# Patient Record
Sex: Male | Born: 1980 | Race: Asian | Hispanic: No | Marital: Married | Smoking: Never smoker
Health system: Southern US, Community
[De-identification: ages and names within clinical notes are randomized; demographics above are authoritative.]

## PROBLEM LIST (undated history)

## (undated) DIAGNOSIS — IMO0002 Reserved for concepts with insufficient information to code with codable children: Secondary | ICD-10-CM

## (undated) DIAGNOSIS — R011 Cardiac murmur, unspecified: Secondary | ICD-10-CM

## (undated) DIAGNOSIS — T7840XA Allergy, unspecified, initial encounter: Secondary | ICD-10-CM

## (undated) HISTORY — DX: Reserved for concepts with insufficient information to code with codable children: IMO0002

## (undated) HISTORY — DX: Cardiac murmur, unspecified: R01.1

## (undated) HISTORY — DX: Allergy, unspecified, initial encounter: T78.40XA

## (undated) HISTORY — PX: CARDIAC SURGERY: SHX584

## (undated) HISTORY — PX: CORONARY ARTERY BYPASS GRAFT: SHX141

---

## 2012-12-02 ENCOUNTER — Emergency Department (HOSPITAL_COMMUNITY): Payer: BC Managed Care – PPO

## 2012-12-02 ENCOUNTER — Encounter (HOSPITAL_COMMUNITY): Payer: Self-pay | Admitting: Emergency Medicine

## 2012-12-02 ENCOUNTER — Emergency Department (HOSPITAL_COMMUNITY)
Admission: EM | Admit: 2012-12-02 | Discharge: 2012-12-02 | Disposition: A | Discharge status: Home / Self Care | Payer: BC Managed Care – PPO | Attending: Emergency Medicine | Admitting: Emergency Medicine

## 2012-12-02 DIAGNOSIS — R51 Headache: Secondary | ICD-10-CM | POA: Insufficient documentation

## 2012-12-02 NOTE — ED Notes (Signed)
Patient transported to CT 

## 2012-12-02 NOTE — ED Notes (Signed)
Per pt, right top of headache on and off for a year-has not seen anyone for symptoms

## 2012-12-02 NOTE — ED Provider Notes (Signed)
CSN: [id number]     Arrival date & time 12/02/12  0940 History   First MD Initiated Contact with Patient 12/02/12 1023     Chief Complaint  Patient presents with  . Headache   (Consider location/radiation/quality/duration/timing/severity/associated sxs/prior Treatment) Patient is a 32 y.o. male presenting with headaches. The history is provided by the patient. No language interpreter was used.  Headache Associated symptoms: no diarrhea, no dizziness, no fever, no nausea, no numbness and no vomiting   Mitchell Perkins is a 32 year old male with no significant past medical history presenting to emergency department with on and off headache that has been ongoing for approximately one year. Patient reports that the headache discomfort is localized at the same spot every time mainly to the right side, vertex region. Patient reports that his current episode of discomfort has been ongoing consistently for one month. Patient reports that when no pressure is applied to the site it is an irritating,bruising sensation, when pressure is applied as a sharp pain. Patient reports that he has been taking Aleve with minimal relief. Patient denies discussing this case with his primary care provider, reported that it was not important to him at the time. Denied radiation of pain. Denied loss of consciousness, head injury, blurred vision, visual distortions, dizziness, and proper balance, syncope, nausea, vomiting, abdominal pain, numbness, tingling, loss of sensation, difficulty swallowing, chest pain, shortness of breath, difficulty breathing, hearing loss or deficiency. Denied history of HTN, DM. PCP Dr. Alanson Aly  History reviewed. No pertinent past medical history. Past Surgical History  Procedure Laterality Date  . Cardiac surgery     No family history on file. History  Substance Use Topics  . Smoking status: Never Smoker   . Smokeless tobacco: Not on file  . Alcohol Use: No    Review of Systems  Constitutional:  Negative for fever and chills.  HENT: Negative for trouble swallowing.   Respiratory: Negative for shortness of breath.   Cardiovascular: Negative for chest pain.  Gastrointestinal: Negative for nausea, vomiting and diarrhea.  Neurological: Positive for headaches. Negative for dizziness, weakness and numbness.  All other systems reviewed and are negative.    Allergies  Review of patient's allergies indicates no known allergies.  Home Medications   Current Outpatient Rx  Name  Route  Sig  Dispense  Refill  . loratadine (CLARITIN) 10 MG tablet   Oral   Take 10 mg by mouth daily as needed for allergies.         . naproxen sodium (ANAPROX) 220 MG tablet   Oral   Take 220 mg by mouth daily as needed (pain).         Marland Kitchen OVER THE COUNTER MEDICATION   Oral   Take 1 tablet by mouth daily as needed (allergies).          BP 115/69  Pulse 69  Temp(Src) 97.8 F (36.6 C) (Oral)  Resp 20  SpO2 97% Physical Exam  Nursing note and vitals reviewed. Constitutional: He is oriented to person, place, and time. He appears well-developed and well-nourished. No distress.  HENT:  Head: Normocephalic and atraumatic.    Mouth/Throat: Oropharynx is clear and moist. No oropharyngeal exudate.  Negative trismus Discomfort upon palpation to the right parietal region, near vertex of scalp - divot noted  Eyes: Conjunctivae and EOM are normal. Pupils are equal, round, and reactive to light. Right eye exhibits no discharge. Left eye exhibits no discharge.  Negative nystagmus  Neck: Normal range of motion. Neck supple.  Cardiovascular: Normal rate, regular rhythm and normal heart sounds.  Exam reveals no friction rub.   No murmur heard. Pulmonary/Chest: Effort normal and breath sounds normal. No respiratory distress. He has no wheezes. He has no rales.  Musculoskeletal: Normal range of motion.  Neurological: He is alert and oriented to person, place, and time. No cranial nerve deficit or sensory  deficit. He exhibits normal muscle tone. Coordination normal. GCS eye subscore is 4. GCS verbal subscore is 5. GCS motor subscore is 6.  Cranial nerves II through XII grossly intact Strength 5+/5+ to upper and lower extremities bilaterally with resistance applied, equal distribution Sensation intact Gait proper, proper balance Negative sway or drop foot, negative step off Patient able to walk heel to toe and onto the toes without difficulty Proper balance with standing  Skin: Skin is warm and dry. He is not diaphoretic. No erythema.  Psychiatric: He has a normal mood and affect. His behavior is normal. Thought content normal.    ED Course  Procedures (including critical care time) Labs Review Labs Reviewed - No data to display Imaging Review Ct Head Wo Contrast  12/02/2012   CLINICAL DATA:  Acute onset headache  EXAM: CT HEAD WITHOUT CONTRAST  TECHNIQUE: Contiguous axial images were obtained from the base of the skull through the vertex without intravenous contrast. Study was obtained within 24 hr of patient's arrival at the emergency department.  COMPARISON:  None.  FINDINGS: The ventricles are normal in size and configuration. There is no mass, hemorrhage, extra-axial fluid collection, or midline shift. Gray-white compartments are normal. There is no demonstrable acute infarct.  There is evidence of previous trauma with remodeling along the skull vertex. There is no acute fracture. No blastic or lytic bone lesion. Mastoid air cells are clear.  IMPRESSION: Evidence of old trauma along the skull vertex with remodeling. Study otherwise unremarkable.   Electronically Signed   By: Bretta Bang M.D.   On: 12/02/2012 11:29    EKG Interpretation   None       MDM   1. Head pain     Filed Vitals:   12/02/12 1017 12/02/12 1026  BP: 111/69 115/69  Pulse: 67 69  Temp: 98.2 F (36.8 C) 97.8 F (36.6 C)  TempSrc: Oral Oral  Resp: 16 20  SpO2: 100% 97%    Patient presenting to  emergency department with head discomfort that has been ongoing intermittently for the past year. Patient reports that it has exacerbated over the past month, has been consistent described as an irritating, pressure sensation with a sharp pain when pressure is applied to the site. Alert and oriented. Negative deformities or trauma noted to the face. Near the right vertex, parietal region patient has a mild digit with discomfort upon palpation. Negative neurological deficits identified. Cranial nerves grossly intact. Full range of motion to upper lower extremity. Gait proper, proper balance. Proper balance and stance. Find motor skills intact. Sensation intact.No neurological deficits and normal neuro exam.   CT head noted evidence of old trauma along the skull vertex with remodeling identified - negative acute abnormalities identified. Patient with back pain.   Patient stable, afebrile. Discharge patient with referral to primary care provider and neurologist. Discussed with patient to rest and stay hydrated discussed with patient to closely monitor symptoms if symptoms are to worsen or change report back to emergency department  - strict return instructions given. Patient agreed to plan of care, understood, all questions answered.    Raymon Mutton, PA-C  12/04/12 1345 

## 2012-12-04 NOTE — ED Provider Notes (Signed)
Medical screening examination/treatment/procedure(s) were performed by non-physician practitioner and as supervising physician I was immediately available for consultation/collaboration.  EKG Interpretation   None        Aeneas Longsworth T Saadia Dewitt, MD 12/04/12 1446 

## 2013-01-03 ENCOUNTER — Other Ambulatory Visit: Payer: Self-pay | Admitting: Neurosurgery

## 2013-01-03 DIAGNOSIS — M542 Cervicalgia: Secondary | ICD-10-CM

## 2013-01-13 ENCOUNTER — Ambulatory Visit
Admission: RE | Admit: 2013-01-13 | Discharge: 2013-01-13 | Disposition: A | Discharge status: Home / Self Care | Payer: BC Managed Care – PPO | Source: Ambulatory Visit | Attending: Neurosurgery | Admitting: Neurosurgery

## 2013-01-13 ENCOUNTER — Other Ambulatory Visit: Payer: Self-pay | Admitting: Neurosurgery

## 2013-01-13 DIAGNOSIS — Z1810 Retained metal fragments, unspecified: Secondary | ICD-10-CM

## 2013-01-13 DIAGNOSIS — M542 Cervicalgia: Secondary | ICD-10-CM

## 2014-06-29 IMAGING — CR DG CHEST 1V
1 series · 1 of 1 positions shown · non-contrast
Comparison: None.

CLINICAL DATA: History of congenital heart defect with open heart
surgery as a child. Pre MRI screening. Possible metallic implants in
the heart.

EXAM:
CHEST - 1 VIEW

[view not recorded]
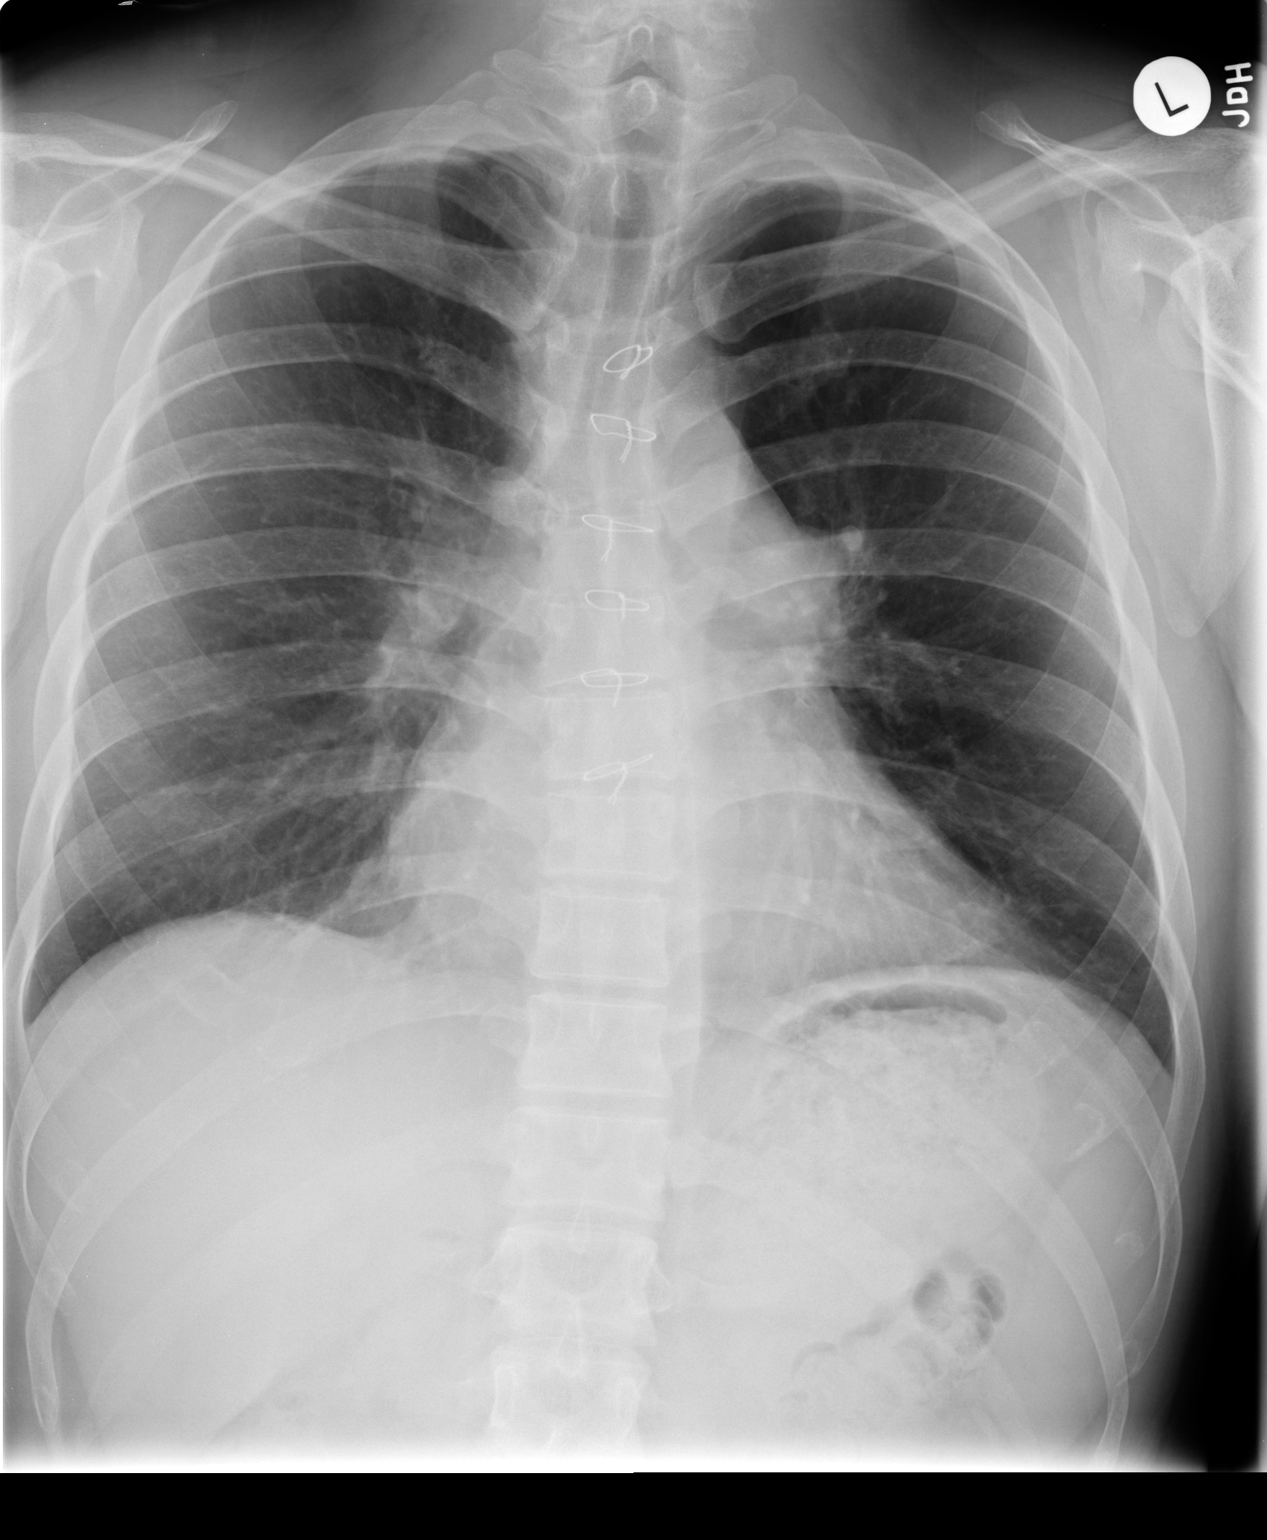

[1 of 1 positions shown; findings below may reference images not displayed]

FINDINGS: The cardiopericardial silhouette is upper limits of normal for
projection. There is prominence of the pulmonary arteries
bilaterally, likely associated with congenital heart disease. Median
sternotomy wires are present compatible with childhood open heart
surgery. Lungs appear clear.

Significantly, and no prosthetic apparatus is identified over the
heart.
IMPRESSION: Postoperative changes of the chest without cardiopulmonary disease.
No radiopaque prosthesis identified precluding MRI.

## 2016-01-09 DIAGNOSIS — R011 Cardiac murmur, unspecified: Secondary | ICD-10-CM | POA: Insufficient documentation

## 2016-01-30 DIAGNOSIS — E785 Hyperlipidemia, unspecified: Secondary | ICD-10-CM | POA: Insufficient documentation

## 2016-01-30 DIAGNOSIS — Q244 Congenital subaortic stenosis: Secondary | ICD-10-CM | POA: Insufficient documentation

## 2019-05-19 ENCOUNTER — Ambulatory Visit: Payer: Self-pay | Attending: Internal Medicine

## 2019-05-19 DIAGNOSIS — Z23 Encounter for immunization: Secondary | ICD-10-CM

## 2019-05-19 NOTE — Progress Notes (Signed)
   Covid-19 Vaccination Clinic  Name:  Mitchell Perkins    MRN: [id number] DOB: 1980/02/15  05/19/2019  Mr. Mitchell Perkins was observed post Covid-19 immunization for 15 minutes without incident. He was provided with Vaccine Information Sheet and instruction to access the V-Safe system.   Mr. Mitchell Perkins was instructed to call 911 with any severe reactions post vaccine: Marland Kitchen Difficulty breathing  . Swelling of face and throat  . A fast heartbeat  . A bad rash all over body  . Dizziness and weakness   Immunizations Administered    Name Date Dose VIS Date Route   Pfizer COVID-19 Vaccine 05/19/2019  2:24 PM 0.3 mL 03/22/2018 Intramuscular   Manufacturer: [employer], [employer]   Lot: [id number]   NDC: [address]-2

## 2019-06-12 ENCOUNTER — Ambulatory Visit: Payer: Self-pay | Attending: Internal Medicine

## 2019-06-12 DIAGNOSIS — Z23 Encounter for immunization: Secondary | ICD-10-CM

## 2019-06-12 NOTE — Progress Notes (Signed)
   Covid-19 Vaccination Clinic  Name:  Benedetto Ryder    MRN: [id number] DOB: Aug 14, 1980  06/12/2019  Mr. Bernabei was observed post Covid-19 immunization for 15 minutes without incident. He was provided with Vaccine Information Sheet and instruction to access the V-Safe system.   Mr. Korol was instructed to call 911 with any severe reactions post vaccine: Marland Kitchen Difficulty breathing  . Swelling of face and throat  . A fast heartbeat  . A bad rash all over body  . Dizziness and weakness   Immunizations Administered    Name Date Dose VIS Date Route   Pfizer COVID-19 Vaccine 06/12/2019  1:40 PM 0.3 mL 03/22/2018 Intramuscular   Manufacturer: [employer], [employer]   Lot: [id number]   NDC: [address]-4

## 2019-12-30 ENCOUNTER — Ambulatory Visit: Payer: Self-pay

## 2020-02-09 ENCOUNTER — Ambulatory Visit (HOSPITAL_COMMUNITY)
Admission: EM | Admit: 2020-02-09 | Discharge: 2020-02-09 | Disposition: A | Discharge status: Home / Self Care | Payer: BLUE CROSS/BLUE SHIELD | Attending: Student | Admitting: Student

## 2020-02-09 ENCOUNTER — Ambulatory Visit (INDEPENDENT_AMBULATORY_CARE_PROVIDER_SITE_OTHER): Payer: BLUE CROSS/BLUE SHIELD

## 2020-02-09 ENCOUNTER — Encounter (HOSPITAL_COMMUNITY): Payer: Self-pay

## 2020-02-09 DIAGNOSIS — R059 Cough, unspecified: Secondary | ICD-10-CM

## 2020-02-09 DIAGNOSIS — J209 Acute bronchitis, unspecified: Secondary | ICD-10-CM

## 2020-02-09 MED ORDER — BENZONATATE 100 MG PO CAPS: 100.0000 mg | ORAL_CAPSULE | Freq: Three times a day (TID) | ORAL | 0 refills | Status: DC

## 2020-02-09 MED ORDER — PREDNISONE 20 MG PO TABS: 40.0000 mg | ORAL_TABLET | Freq: Every day | ORAL | 0 refills | Status: AC

## 2020-02-09 MED ORDER — ALBUTEROL SULFATE HFA 108 (90 BASE) MCG/ACT IN AERS: 1.0000 | INHALATION_SPRAY | Freq: Four times a day (QID) | RESPIRATORY_TRACT | 0 refills | Status: AC | PRN

## 2020-02-09 NOTE — ED Triage Notes (Signed)
Pt presents with a cough that started 01/04. He states he went to an urgent care that gave him cough syrup. He states he did not have any relief. Pt states he was told to come and get a chest x-ray done. Pt states he is not having any other sxs except for a cough. Pt denies chest pain. Pt states he was tested last week and his results came back negative today.

## 2020-02-09 NOTE — Discharge Instructions (Signed)
-  Prednisone 40mg  (two pills) daily for 5 days -Tessalon (Benzonatate) as needed, up to 3x daily  -Albuterol inhaler 1-2 puffs as needed -Continue cough syrup

## 2020-02-09 NOTE — ED Provider Notes (Signed)
[facility]    CSN: [id number] Arrival date & time: 02/09/20  1154      History   Chief Complaint Chief Complaint  Patient presents with  . Cough    HPI Mitchell Perkins is a 40 y.o. male Presenting for URI symptoms for 2 weeks. States he first developed URI symptoms 2 weeks ago on 01/28/2020. Had a negative covid test 01/30/2020. Continued to experience significant cough until 02/03/2020, upon which he presented to urgent care. They treated him for bronchitis with zpack, cough syrup, tessalon, prednisone 40mg  x5 days. He presents today for continued cough. States he's coughing every 20 minutes. Productive cough with clear mucus. Feeling well otherwise- denies shortness of breath, sore throat, fevers. Denies fevers/chills, n/v/d, shortness of breath, chest pain, congestion, facial pain, teeth pain, headaches, sore throat, loss of taste/smell, swollen lymph nodes, ear pain. Denies chest pain, shortness of breath, confusion, high fevers.   HPI  History reviewed. No pertinent past medical history.  There are no problems to display for this patient.   Past Surgical History:  Procedure Laterality Date  . CARDIAC SURGERY         Home Medications    Prior to Admission medications   Medication Sig Start Date End Date Taking? Authorizing Provider  albuterol (VENTOLIN HFA) 108 (90 Base) MCG/ACT inhaler Inhale 1-2 puffs into the lungs every 6 (six) hours as needed for wheezing or shortness of breath. 02/09/20  Yes 02/11/20, PA-C  benzonatate (TESSALON) 100 MG capsule Take 1 capsule (100 mg total) by mouth every 8 (eight) hours. 02/09/20  Yes 02/11/20, PA-C  predniSONE (DELTASONE) 20 MG tablet Take 2 tablets (40 mg total) by mouth daily for 5 days. 02/09/20 02/14/20 Yes 02/16/20, PA-C  loratadine (CLARITIN) 10 MG tablet Take 10 mg by mouth daily as needed for allergies.    [provider]  naproxen sodium (ANAPROX) 220 MG tablet Take 220 mg by mouth daily as  needed (pain).    [provider]  OVER THE COUNTER MEDICATION Take 1 tablet by mouth daily as needed (allergies).    [provider]    Family History History reviewed. No pertinent family history.  Social History Social History   Tobacco Use  . Smoking status: Never Smoker  . Smokeless tobacco: Never Used  Substance Use Topics  . Alcohol use: No  . Drug use: No     Allergies   Patient has no known allergies.   Review of Systems Review of Systems  Constitutional: Negative for appetite change, chills and fever.  HENT: Negative for congestion, ear pain, rhinorrhea, sinus pressure, sinus pain and sore throat.   Eyes: Negative for redness and visual disturbance.  Respiratory: Positive for cough. Negative for chest tightness, shortness of breath and wheezing.   Cardiovascular: Negative for chest pain and palpitations.  Gastrointestinal: Negative for abdominal pain, constipation, diarrhea, nausea and vomiting.  Genitourinary: Negative for dysuria, frequency and urgency.  Musculoskeletal: Negative for myalgias.  Neurological: Negative for dizziness, weakness and headaches.  Psychiatric/Behavioral: Negative for confusion.  All other systems reviewed and are negative.    Physical Exam Triage Vital Signs ED Triage Vitals  Enc Vitals Group     BP 02/09/20 1330 127/84     Pulse Rate 02/09/20 1330 (!) 110     Resp 02/09/20 1330 15     Temp 02/09/20 1330 98 F (36.7 C)     Temp Source 02/09/20 1330 Oral  SpO2 02/09/20 1330 98 %     Weight --      Height --      Head Circumference --      Peak Flow --      Pain Score 02/09/20 1328 0     Pain Loc --      Pain Edu? --      Excl. in GC? --    No data found.  Updated Vital Signs BP 127/84 (BP Location: Right Arm)   Pulse (!) 110   Temp 98 F (36.7 C) (Oral)   Resp 15   SpO2 98%   Visual Acuity Right Eye Distance:   Left Eye Distance:   Bilateral Distance:    Right Eye Near:   Left Eye  Near:    Bilateral Near:     Physical Exam Vitals reviewed.  Constitutional:      General: He is not in acute distress.    Appearance: Normal appearance. He is not ill-appearing.  HENT:     Head: Normocephalic and atraumatic.     Right Ear: Hearing, tympanic membrane, ear canal and external ear normal. No swelling or tenderness. There is no impacted cerumen. No mastoid tenderness. Tympanic membrane is not perforated, erythematous, retracted or bulging.     Left Ear: Hearing, tympanic membrane, ear canal and external ear normal. No swelling or tenderness. There is no impacted cerumen. No mastoid tenderness. Tympanic membrane is not perforated, erythematous, retracted or bulging.     Nose:     Right Sinus: No maxillary sinus tenderness or frontal sinus tenderness.     Left Sinus: No maxillary sinus tenderness or frontal sinus tenderness.     Mouth/Throat:     Mouth: Mucous membranes are moist.     Pharynx: Uvula midline. No oropharyngeal exudate or posterior oropharyngeal erythema.     Tonsils: No tonsillar exudate.  Cardiovascular:     Rate and Rhythm: Regular rhythm. Tachycardia present.     Heart sounds: Normal heart sounds.  Pulmonary:     Breath sounds: Normal breath sounds and air entry. No wheezing, rhonchi or rales.  Chest:     Chest wall: No tenderness.  Abdominal:     General: Abdomen is flat. Bowel sounds are normal.     Tenderness: There is no abdominal tenderness. There is no guarding or rebound.  Lymphadenopathy:     Cervical: No cervical adenopathy.  Neurological:     General: No focal deficit present.     Mental Status: He is alert and oriented to person, place, and time.  Psychiatric:        Attention and Perception: Attention and perception normal.        Mood and Affect: Mood and affect normal.        Behavior: Behavior normal. Behavior is cooperative.        Thought Content: Thought content normal.        Judgment: Judgment normal.      UC Treatments /  Results  Labs (all labs ordered are listed, but only abnormal results are displayed) Labs Reviewed - No data to display  EKG   Radiology DG Chest 2 View  Result Date: 02/09/2020 CLINICAL DATA:  Cough times 10 days. EXAM: CHEST - 2 VIEW COMPARISON:  03/18/2016 FINDINGS: The lungs are clear without focal pneumonia, edema, pneumothorax or pleural effusion. The cardiopericardial silhouette is within normal limits for size. Fullness in the AP window/left hilum is stable in the interval. The visualized bony structures of  the thorax show no acute abnormality. IMPRESSION: Stable exam. No acute cardiopulmonary findings. Electronically Signed   By: Kennith Center M.D.   On: 02/09/2020 15:08    Procedures Procedures (including critical care time)  Medications Ordered in UC Medications - No data to display  Initial Impression / Assessment and Plan / UC Course  I have reviewed the triage vital signs and the nursing notes.  Pertinent labs & imaging results that were available during my care of the patient were reviewed by me and considered in my medical decision making (see chart for details).     Afebrile mildly tachycardic nontachypneic oxygenating well on room air, no wheezes rhonchi or rales.  CXR today with no acute cardiopulmonary disease.   Continue tessalon, cough syrup.  Discussed that pt does not have pneumonia and does not require treatment with antibiotic at this time.  Return precautions- chest pain, shortness of breath, new/worsening fevers/chills, confusion, worsening of symptoms despite the above treatment plan, etc.   Deferred covid test as pt already had negative covid test and he has had symptoms for >10 days. Stable exam. No acute cardiopulmonary findings.    Final Clinical Impressions(s) / UC Diagnoses   Final diagnoses:  Acute bronchitis, unspecified organism  Cough     Discharge Instructions     -Prednisone 40mg  (two pills) daily for 5 days -Tessalon  (Benzonatate) as needed, up to 3x daily  -Albuterol inhaler 1-2 puffs as needed -Continue cough syrup    ED Prescriptions    Medication Sig Dispense Auth. Provider   albuterol (VENTOLIN HFA) 108 (90 Base) MCG/ACT inhaler Inhale 1-2 puffs into the lungs every 6 (six) hours as needed for wheezing or shortness of breath. 1 each , PA-C   benzonatate (TESSALON) 100 MG capsule Take 1 capsule (100 mg total) by mouth every 8 (eight) hours. 21 capsule Rhys Martini, PA-C   predniSONE (DELTASONE) 20 MG tablet Take 2 tablets (40 mg total) by mouth daily for 5 days. 10 tablet Rhys Martini, PA-C     PDMP not reviewed this encounter.   Rhys Martini, PA-C 02/09/20 1536

## 2021-06-02 ENCOUNTER — Telehealth: Payer: Self-pay

## 2021-06-02 NOTE — Telephone Encounter (Signed)
NOTES FROM DR Jolyn Nap MARIA ?

## 2021-06-18 DIAGNOSIS — Q244 Congenital subaortic stenosis: Secondary | ICD-10-CM | POA: Insufficient documentation

## 2021-06-18 NOTE — Progress Notes (Addendum)
Cardiology Office Note   Date:  06/19/2021   ID:  Mitchell Perkins, DOB 04/07/1980, MRN [id number]  PCP:  Angelica Chessman, MD  Cardiologist:   Rollene Rotunda, MD Referring:  Angelica Chessman, MD  Chief Complaint  Patient presents with   Congenital Heart Disease      History of Present Illness: Mitchell Perkins is a 41 y.o. 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  The picture is more complicated as we talk about it in the office.  He lived in [place] where he was told he had a murmur at age 70 but not until age 7 did he require anything in fact did not have much suggested follow-up until that time.  He then reports he had a catheterization.  He is very knowledgeable but he does not know the details.  He said he ultimately underwent three-vessel bypass with what sounds like a complicated prolonged surgery potentially with some stenting.  There is mention in his chart of subaortic stenosis.  I do not know any of the other details.  I looked through the records and saw that he did see a cardiologist once at [facility].  They had an echo but I do not have these results.  He was told he did not need to return.  There are no records from [place].  He has had a residual murmur but is otherwise done well.  He has not had any chest pressure, neck or arm discomfort.  He is never had any palpitations, presyncope or syncope.  Has had no shortness of breath, PND or orthopnea.  He stays active doing his own yard work.  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  Past Surgical History:  Procedure Laterality Date   CARDIAC SURGERY       Current Outpatient Medications  Medication Sig Dispense Refill   albuterol (VENTOLIN HFA) 108 (90 Base) MCG/ACT inhaler Inhale 1-2 puffs into the lungs every 6 (six) hours as needed for wheezing or shortness of breath. 1 each 0   loratadine (CLARITIN) 10 MG tablet Take 10 mg by mouth daily as  needed for allergies.     naproxen sodium (ANAPROX) 220 MG tablet Take 220 mg by mouth daily as needed (pain).     OVER THE COUNTER MEDICATION Take 1 tablet by mouth daily as needed (allergies).     No current facility-administered medications for this visit.    Allergies:   Patient has no known allergies.    Social History:  The patient  reports that he has never smoked. He has never used smokeless tobacco. He reports that he does not drink alcohol and does not use drugs.   Family History:  The patient's is noncontributory.  There is no coronary artery disease.   ROS:  Please see the history of present illness.   Otherwise, review of systems are positive for none.   All other systems are reviewed and negative.    PHYSICAL EXAM: VS:  BP 122/74   Pulse 90   Ht 5\' 3"  (1.6 m)   Wt 163 lb 12.8 oz (74.3 kg)   SpO2 98%   BMI 29.02 kg/m  , BMI Body mass index is 29.02 kg/m.  Blood pressures equal in both arms GENERAL:  Well appearing HEENT:  Pupils equal round and reactive, fundi not visualized, oral mucosa unremarkable NECK:  No jugular venous distention, waveform within normal limits, carotid upstroke brisk  and symmetric, no bruits, no thyromegaly LYMPHATICS:  No cervical, inguinal adenopathy LUNGS:  Clear to auscultation bilaterally BACK:  No CVA tenderness CHEST:  Unremarkable HEART:  PMI not displaced or sustained,S1 and S2 within normal limits, no S3, no S4, no clicks, no rubs, 3 out of 6 systolic murmur heard throughout the precordium and somewhat holosystolic and not increasing with the strain phase of pelvic, no diastolic murmurs ABD:  Flat, positive bowel sounds normal in frequency in pitch, no bruits, no rebound, no guarding, no midline pulsatile mass, no hepatomegaly, no splenomegaly EXT:  2 plus pulses throughout, no edema, no cyanosis no clubbing SKIN:  No rashes no nodules NEURO:  Cranial nerves II through XII grossly intact, motor grossly intact throughout PSYCH:   Cognitively intact, oriented to person place and time    EKG:  EKG is ordered today. The ekg ordered today demonstrates sinus rhythm, rate 90, axis within normal limits, left ventricular perjury by voltage criteria with repolarization changes but without old EKGs for comparison.   Recent Labs: No results found for requested labs within last 8760 hours.    Lipid Panel No results found for: CHOL, TRIG, HDL, CHOLHDL, VLDL, LDLCALC, LDLDIRECT    Wt Readings from Last 3 Encounters:  06/19/21 163 lb 12.8 oz (74.3 kg)      Other studies Reviewed: Additional studies/ records that were reviewed today include: [facility] cardiology. Review of the above records demonstrates:  Please see elsewhere in the note.     ASSESSMENT AND PLAN:  Congential subaortic stenosis: The patient has what sounds like a more complicated congenital history than simply a subaortic stenosis.  He describes an extensive surgery and bypasses.  I do not have any of these data.  I will try to get the echo from [facility].  I will keep in contact with the patient to get his old records from [place].  Based on this I will then suggest whether he needs to follow-up with adult congenital.  We did talk about endocarditis prophylaxis and in the absence of knowing exactly what he had done I would suggest he continue to do this which has been doing for years.   Current medicines are reviewed at length with the patient today.  The patient does not have concerns regarding medicines.  The following changes have been made:  no change  Labs/ tests ordered today include:   Orders Placed This Encounter  Procedures   EKG 12-Lead     Disposition:   FU with me as needed.  We will potentially arrange follow-up as above.   Signed, Rollene Rotunda, MD  06/19/2021 3:12 PM    [facility]   Addendum: I was able to get some records from [facility].  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  Cath was done in 1998.  He had reconstruction of the right ventricular outflow tract and tricuspid valve repair and closure of a PFO of note the patient mentions bypass surgery but I think it was incorrect and that recollection is there is no record of this.  I do think it is reasonable since it has been several years to get an echocardiogram and if this looks stable with his RV outflow and tricuspid valve he would not need further follow-up.

## 2021-06-19 ENCOUNTER — Ambulatory Visit: Payer: BLUE CROSS/BLUE SHIELD | Admitting: Cardiology

## 2021-06-19 ENCOUNTER — Encounter: Payer: Self-pay | Admitting: Cardiology

## 2021-06-19 ENCOUNTER — Other Ambulatory Visit: Payer: Self-pay | Admitting: *Deleted

## 2021-06-19 VITALS — BP 122/74 | HR 90 | Ht 63.0 in | Wt 163.8 lb

## 2021-06-19 DIAGNOSIS — Q244 Congenital subaortic stenosis: Secondary | ICD-10-CM | POA: Diagnosis not present

## 2021-06-19 NOTE — Patient Instructions (Addendum)
Medication Instructions:  No changes  *If you need a refill on your cardiac medications before your next appointment, please call your pharmacy*   Lab Work: Not needed    Testing/Procedures: Not needed   Follow-Up: At [facility], you and your health needs are our priority.  As part of our continuing mission to provide you with exceptional heart care, we have created designated Provider Care Teams.  These Care Teams include your primary Cardiologist (physician) and Advanced Practice Providers (APPs -  Physician Assistants and Nurse Practitioners) who all work together to provide you with the care you need, when you need it.  We recommend signing up for the patient portal called "MyChart".  Sign up information is provided on this After Visit Summary.  MyChart is used to connect with patients for Virtual Visits (Telemedicine).  Patients are able to view lab/test results, encounter notes, upcoming appointments, etc.  Non-urgent messages can be sent to your provider as well.   To learn more about what you can do with MyChart, go to [email or web address].    Your next appointment:   As needed   The format for your next appointment:   In Person  Provider:   Rollene Rotunda, MD     Other Instructions  Will obtain echo report from  [facility]  system -from 2018   Would continue with SBE  prophylaxis- antibiotic   Important Information About Sugar

## 2022-11-09 ENCOUNTER — Telehealth: Payer: Self-pay | Admitting: *Deleted

## 2022-11-09 DIAGNOSIS — Q249 Congenital malformation of heart, unspecified: Secondary | ICD-10-CM

## 2022-11-09 NOTE — Telephone Encounter (Signed)
Spoke with pt, dr hochrein finally got his records from [place] and he would like for the patient to have an echocardiogram. If the echo is fine, he would not have to see a congential doctor as an adult. Order placed for echocardiogram.

## 2022-11-27 ENCOUNTER — Ambulatory Visit (HOSPITAL_COMMUNITY): Payer: 59 | Attending: Cardiology

## 2022-11-27 DIAGNOSIS — Q249 Congenital malformation of heart, unspecified: Secondary | ICD-10-CM | POA: Diagnosis present

## 2022-11-27 LAB — ECHOCARDIOGRAM COMPLETE
Area-P 1/2: 5.06 cm2
S' Lateral: 1.9 cm

## 2022-11-27 MED ORDER — PERFLUTREN LIPID MICROSPHERE
1.0000 mL | INTRAVENOUS | Status: AC | PRN
  Administered 2022-11-27: 2 mL via INTRAVENOUS

## 2023-04-14 ENCOUNTER — Telehealth (HOSPITAL_BASED_OUTPATIENT_CLINIC_OR_DEPARTMENT_OTHER): Payer: Self-pay | Admitting: *Deleted

## 2023-04-14 NOTE — Telephone Encounter (Signed)
 Copied from CRM [id number]. Topic: Appointments - Transfer of Care >> Apr 14, 2023 11:57 AM Antwanette L wrote: Pt is requesting to transfer FROM: Dr.Aguiar Pt is requesting to transfer TO: Jerre Simon Reason for requested transfer: Previous office is too far out and patients appointments kept getting cancelled It is the responsibility of the team the patient would like to transfer to [facility]) to reach out to the patient if for any reason this transfer is not acceptable.    Pt is currently scheduled for an appt with Jon Gills 06/28/23. Appt will stay as scheduled.

## 2023-06-28 ENCOUNTER — Other Ambulatory Visit (HOSPITAL_BASED_OUTPATIENT_CLINIC_OR_DEPARTMENT_OTHER): Payer: Self-pay | Admitting: Family Medicine

## 2023-06-28 ENCOUNTER — Ambulatory Visit (INDEPENDENT_AMBULATORY_CARE_PROVIDER_SITE_OTHER): Admitting: Family Medicine

## 2023-06-28 ENCOUNTER — Encounter (HOSPITAL_BASED_OUTPATIENT_CLINIC_OR_DEPARTMENT_OTHER): Payer: Self-pay | Admitting: Family Medicine

## 2023-06-28 VITALS — BP 111/73 | HR 65 | Ht 63.0 in | Wt 162.5 lb

## 2023-06-28 DIAGNOSIS — Z0000 Encounter for general adult medical examination without abnormal findings: Secondary | ICD-10-CM

## 2023-06-28 DIAGNOSIS — E785 Hyperlipidemia, unspecified: Secondary | ICD-10-CM

## 2023-06-28 DIAGNOSIS — R519 Headache, unspecified: Secondary | ICD-10-CM | POA: Diagnosis not present

## 2023-06-28 DIAGNOSIS — Q244 Congenital subaortic stenosis: Secondary | ICD-10-CM

## 2023-06-28 NOTE — Patient Instructions (Addendum)
 Dr. Lavonne Prairie- Cardiology - Please call to determine follow up  [facility] [address] [place], [place] [address] [phone]

## 2023-06-28 NOTE — Progress Notes (Signed)
 New Patient Office Visit  Subjective:   Mitchell Perkins [birth year] 06/28/2023  Chief Complaint  Patient presents with   New Patient (Initial Visit)    Patient is here today to get established with the practice. Denies any main concerns for today's visit.    HPI: Mitchell Perkins presents today to establish care at [facility] at [facility]. Introduced to Publishing rights manager role and practice setting.  All questions answered.   Last PCP: Dr. Aguilar ([facility])  Last annual physical: December 2024 Concerns: See below    Congenital Subaortic stenosis:  Patient has a hx of congenital subaortic stenosis with bypass and cardiac catheterization in his adolescence in 1998 [place]  [facility].  Patient has been an established patient of [facility] since 2023.  Cardiology follows patient every 2 years with echocardiogram last done in November 2024 showing mild aortic outflow gradient that was not clinically significant.  Dr. Lavonne Prairie recommended seeing patient in 2 years.  He will call to schedule.  Patient is currently asymptomatic.   HEADACHE:  Patient reports intermittent headache to posterior left side of head. He states frequency is rare.Pain is relieved by is tylenol and ibuprofen. Reports he does have mild blurred vision when headache occurs, he reports resting and closing his eyes will resolve.  He denies nausea, vomiting, confusion, gait abnormality or neurological disturbance.  He does not miss work due to these episodes and reports this occurs less than 1 time per month.  Denies history of migraines.    The following portions of the patient's history were reviewed and updated as appropriate: past medical history, past surgical history, family history, social history, allergies, medications, and problem list.   Patient Active Problem List   Diagnosis Date Noted   Congenital subaortic stenosis 01/30/2016   Dyslipidemia  01/30/2016   Murmur, cardiac 01/09/2016   Past Medical History:  Diagnosis Date   Allergy    Heart murmur    Ulcer    Past Surgical History:  Procedure Laterality Date   CARDIAC SURGERY     CORONARY ARTERY BYPASS GRAFT     43 yo had triple bypass   Family History  Problem Relation Age of Onset   Hyperlipidemia Father    Alzheimer's disease Maternal Grandmother    Social History   Socioeconomic History   Marital status: Married    Spouse name: Not on file   Number of children: Not on file   Years of education: Not on file   Highest education level: Bachelor's degree (e.g., BA, AB, BS)  Occupational History   Not on file  Tobacco Use   Smoking status: Never   Smokeless tobacco: Never  Substance and Sexual Activity   Alcohol use: No   Drug use: No   Sexual activity: Yes    Birth control/protection: None  Other Topics Concern   Not on file  Social History Narrative   Married.     Social Drivers of Corporate investment banker Strain: Low Risk  (06/27/2023)   Overall Financial Resource Strain (CARDIA)    Difficulty of Paying Living Expenses: Not hard at all  Food Insecurity: No Food Insecurity (06/27/2023)   Hunger Vital Sign    Worried About Running Out of Food in the Last Year: Never true    Ran Out of Food in the Last Year: Never true  Transportation Needs: No Transportation Needs (06/27/2023)   PRAPARE - Administrator, Civil Service (Medical):  No    Lack of Transportation (Non-Medical): No  Physical Activity: Insufficiently Active (06/27/2023)   Exercise Vital Sign    Days of Exercise per Week: 2 days    Minutes of Exercise per Session: 10 min  Stress: Stress Concern Present (06/27/2023)   [employer] of Occupational Health - Occupational Stress Questionnaire    Feeling of Stress : To some extent  Social Connections: Socially Integrated (06/27/2023)   Social Connection and Isolation Panel [NHANES]    Frequency of Communication with Friends and  Family: More than three times a week    Frequency of Social Gatherings with Friends and Family: Twice a week    Attends Religious Services: More than 4 times per year    Active Member of [employer] or Organizations: Yes    Attends Engineer, structural: More than 4 times per year    Marital Status: Married  Catering manager Violence: Unknown (05/01/2021)   Received from [employer], [facility]   HITS    Physically Hurt: Not on file    Insult or Talk Down To: Not on file    Threaten Physical Harm: Not on file    Scream or Curse: Not on file   Outpatient Medications Prior to Visit  Medication Sig Dispense Refill   albuterol  (VENTOLIN  HFA) 108 (90 Base) MCG/ACT inhaler Inhale 1-2 puffs into the lungs every 6 (six) hours as needed for wheezing or shortness of breath. 1 each 0   loratadine (CLARITIN) 10 MG tablet Take 10 mg by mouth daily as needed for allergies.     Multiple Vitamin (MULTIVITAMIN WITH MINERALS) TABS tablet Take 1 tablet by mouth daily.     naproxen sodium (ANAPROX) 220 MG tablet Take 220 mg by mouth daily as needed (pain).     OVER THE COUNTER MEDICATION Take 1 tablet by mouth daily as needed (allergies).     No facility-administered medications prior to visit.   No Known Allergies  ROS: A complete ROS was performed with pertinent positives/negatives noted in the HPI. The remainder of the ROS are negative.   Objective:   Today's Vitals   06/28/23 0804  BP: 111/73  Pulse: 65  SpO2: 100%  Weight: 162 lb 8 oz (73.7 kg)  Height: 5\' 3"  (1.6 m)    GENERAL: Well-appearing, in NAD. Well nourished.  SKIN: Pink, warm and dry. No rash, lesion, ulceration, or ecchymoses.  Head: Normocephalic. NECK: Trachea midline. Full ROM w/o pain or tenderness. No lymphadenopathy.  EARS: Tympanic membranes are intact, translucent without bulging and without drainage. Appropriate landmarks visualized.  EYES: Conjunctiva clear without exudates. EOMI, PERRL, no drainage present.   NOSE: Septum midline w/o deformity. Nares patent, mucosa pink and non-inflamed w/o drainage. No sinus tenderness.  THROAT: Uvula midline. Oropharynx clear. Tonsils non-inflamed without exudate. Mucous membranes pink and moist.  RESPIRATORY: Chest wall symmetrical. Respirations even and non-labored. Breath sounds clear to auscultation bilaterally.  CARDIAC: S1, S2 present, regular rate and rhythm with systolic Grade II Murmur. No gallops. Peripheral pulses 2+ bilaterally.  MSK: Muscle tone and strength appropriate for age.  NEUROLOGIC: No motor or sensory deficits. Steady, even gait. C2-C12 intact.  PSYCH/MENTAL STATUS: Alert, oriented x 3. Cooperative, appropriate mood and affect.    Health Maintenance Due  Topic Date Due   HIV Screening  Never done   Hepatitis C Screening  Never done   COVID-19 Vaccine (3 - 2024-25 season) 09/27/2022    No results found for any visits on 06/28/23.  Assessment & Plan:  1. Congenital subaortic stenosis (Primary) Managed by Cardiology. Patient to call Dr. Lavonne Prairie and discuss needed follow up Q2years. Contact information provided to patient and he was agreeable.   2. Intractable episodic headache, unspecified headache type Resolvable headache likely due to ocular strain and frequent blue light exposure. Recommend blue light glasses, frequent breaks at work to move around, step outside, and reducing tension in neck/shoulders. If frequency worsens or unable to improve with oTC medications, reach out to PCP. No concerning neuro findings on exam.    Patient to reach out to office if new, worrisome, or unresolved symptoms arise or if no improvement in patient's condition. Patient verbalized understanding and is agreeable to treatment plan. All questions answered to patient's satisfaction.    Return in about 27 weeks (around 01/03/2024) for ANNUAL PHYSICAL (fasting las prior) .    Nonda Bays, [place]

## 2024-01-03 ENCOUNTER — Encounter (HOSPITAL_BASED_OUTPATIENT_CLINIC_OR_DEPARTMENT_OTHER): Admitting: Family Medicine
